# Patient Record
Sex: Male | Born: 1935 | Race: White | Hispanic: No | Marital: Married | Smoking: Never smoker
Health system: Southern US, Community
[De-identification: ages and names within clinical notes are randomized; demographics above are authoritative.]

## PROBLEM LIST (undated history)

## (undated) DIAGNOSIS — K219 Gastro-esophageal reflux disease without esophagitis: Secondary | ICD-10-CM

## (undated) DIAGNOSIS — G459 Transient cerebral ischemic attack, unspecified: Secondary | ICD-10-CM

## (undated) HISTORY — PX: CARDIAC SURGERY: SHX584

## (undated) HISTORY — PX: CORONARY ANGIOPLASTY WITH STENT PLACEMENT: SHX49

## (undated) HISTORY — PX: SKIN CANCER EXCISION: SHX779

## (undated) HISTORY — PX: PROSTATECTOMY: SHX69

---

## 2017-09-08 ENCOUNTER — Emergency Department (HOSPITAL_COMMUNITY): Payer: Medicare HMO

## 2017-09-08 ENCOUNTER — Other Ambulatory Visit: Payer: Self-pay

## 2017-09-08 ENCOUNTER — Encounter (HOSPITAL_COMMUNITY): Payer: Self-pay | Admitting: Emergency Medicine

## 2017-09-08 ENCOUNTER — Emergency Department (HOSPITAL_COMMUNITY)
Admission: EM | Admit: 2017-09-08 | Discharge: 2017-09-08 | Disposition: A | Discharge status: Home / Self Care | Payer: Medicare HMO | Attending: Emergency Medicine | Admitting: Emergency Medicine

## 2017-09-08 DIAGNOSIS — Z8673 Personal history of transient ischemic attack (TIA), and cerebral infarction without residual deficits: Secondary | ICD-10-CM | POA: Insufficient documentation

## 2017-09-08 DIAGNOSIS — K219 Gastro-esophageal reflux disease without esophagitis: Secondary | ICD-10-CM | POA: Diagnosis not present

## 2017-09-08 DIAGNOSIS — R112 Nausea with vomiting, unspecified: Secondary | ICD-10-CM | POA: Insufficient documentation

## 2017-09-08 DIAGNOSIS — R109 Unspecified abdominal pain: Secondary | ICD-10-CM | POA: Diagnosis not present

## 2017-09-08 DIAGNOSIS — Z7982 Long term (current) use of aspirin: Secondary | ICD-10-CM | POA: Diagnosis not present

## 2017-09-08 DIAGNOSIS — Z79899 Other long term (current) drug therapy: Secondary | ICD-10-CM | POA: Diagnosis not present

## 2017-09-08 DIAGNOSIS — R195 Other fecal abnormalities: Secondary | ICD-10-CM | POA: Insufficient documentation

## 2017-09-08 DIAGNOSIS — R197 Diarrhea, unspecified: Secondary | ICD-10-CM

## 2017-09-08 HISTORY — DX: Gastro-esophageal reflux disease without esophagitis: K21.9

## 2017-09-08 HISTORY — DX: Transient cerebral ischemic attack, unspecified: G45.9

## 2017-09-08 LAB — COMPREHENSIVE METABOLIC PANEL
ALBUMIN: 4.2 g/dL (ref 3.5–5.0)
ALT: 22 U/L (ref 17–63)
ANION GAP: 11 (ref 5–15)
AST: 35 U/L (ref 15–41)
Alkaline Phosphatase: 66 U/L (ref 38–126)
BUN: 22 mg/dL — AB (ref 6–20)
CHLORIDE: 103 mmol/L (ref 101–111)
CO2: 25 mmol/L (ref 22–32)
Calcium: 8.9 mg/dL (ref 8.9–10.3)
Creatinine, Ser: 1.12 mg/dL (ref 0.61–1.24)
GFR calc Af Amer: >60 mL/min (ref >60)
GFR, EST NON AFRICAN AMERICAN: 59 mL/min — AB (ref >60)
GLUCOSE: 143 mg/dL — AB (ref 65–99)
POTASSIUM: 3.6 mmol/L (ref 3.5–5.1)
Sodium: 139 mmol/L (ref 135–145)
Total Bilirubin: 0.8 mg/dL (ref 0.3–1.2)
Total Protein: 7.4 g/dL (ref 6.5–8.1)

## 2017-09-08 LAB — CBC
HEMATOCRIT: 42.6 % (ref 39.0–52.0)
HEMOGLOBIN: 13.9 g/dL (ref 13.0–17.0)
MCH: 30.2 pg (ref 26.0–34.0)
MCHC: 32.6 g/dL (ref 30.0–36.0)
MCV: 92.6 fL (ref 78.0–100.0)
Platelets: 189 10*3/uL (ref 150–400)
RBC: 4.6 MIL/uL (ref 4.22–5.81)
RDW: 13.2 % (ref 11.5–15.5)
WBC: 8.9 10*3/uL (ref 4.0–10.5)

## 2017-09-08 LAB — URINALYSIS, ROUTINE W REFLEX MICROSCOPIC
Bacteria, UA: NONE SEEN
Bilirubin Urine: NEGATIVE
GLUCOSE, UA: NEGATIVE mg/dL
Hgb urine dipstick: NEGATIVE
Ketones, ur: 5 mg/dL — AB
Leukocytes, UA: NEGATIVE
NITRITE: NEGATIVE
PH: 5 (ref 5.0–8.0)
Protein, ur: 30 mg/dL — AB
SPECIFIC GRAVITY, URINE: 1.027 (ref 1.005–1.030)
Squamous Epithelial / LPF: NONE SEEN

## 2017-09-08 LAB — CBC WITH DIFFERENTIAL/PLATELET
BASOS ABS: 0 10*3/uL (ref 0.0–0.1)
BASOS PCT: 0 %
EOS PCT: 0 %
Eosinophils Absolute: 0 10*3/uL (ref 0.0–0.7)
HCT: 48.8 % (ref 39.0–52.0)
Hemoglobin: 16.1 g/dL (ref 13.0–17.0)
LYMPHS PCT: 6 %
Lymphs Abs: 0.7 10*3/uL (ref 0.7–4.0)
MCH: 30.6 pg (ref 26.0–34.0)
MCHC: 33 g/dL (ref 30.0–36.0)
MCV: 92.6 fL (ref 78.0–100.0)
Monocytes Absolute: 0.6 10*3/uL (ref 0.1–1.0)
Monocytes Relative: 5 %
Neutro Abs: 10.9 10*3/uL — ABNORMAL HIGH (ref 1.7–7.7)
Neutrophils Relative %: 89 %
PLATELETS: 236 10*3/uL (ref 150–400)
RBC: 5.27 MIL/uL (ref 4.22–5.81)
RDW: 13.2 % (ref 11.5–15.5)
WBC: 12.2 10*3/uL — AB (ref 4.0–10.5)

## 2017-09-08 LAB — POC OCCULT BLOOD, ED: FECAL OCCULT BLD: NEGATIVE

## 2017-09-08 LAB — LACTIC ACID, PLASMA
LACTIC ACID, VENOUS: 1.9 mmol/L (ref 0.5–1.9)
LACTIC ACID, VENOUS: 1.6 mmol/L (ref 0.5–1.9)

## 2017-09-08 LAB — PROTIME-INR
INR: 0.99
PROTHROMBIN TIME: 13 s (ref 11.4–15.2)

## 2017-09-08 LAB — TROPONIN I: Troponin I: <0.03 ng/mL (ref <0.03)

## 2017-09-08 LAB — LIPASE, BLOOD: LIPASE: 20 U/L (ref 11–51)

## 2017-09-08 MED ORDER — ONDANSETRON HCL 4 MG/2ML IJ SOLN
4.0000 mg | INTRAMUSCULAR | Status: DC | PRN
Start: 2017-09-08 — End: 2017-09-08
  Administered 2017-09-08: 4 mg via INTRAVENOUS
  Filled 2017-09-08: qty 2

## 2017-09-08 MED ORDER — FAMOTIDINE IN NACL 20-0.9 MG/50ML-% IV SOLN
20.0000 mg | Freq: Once | INTRAVENOUS | Status: AC
  Administered 2017-09-08: 20 mg via INTRAVENOUS
  Filled 2017-09-08: qty 50

## 2017-09-08 MED ORDER — SODIUM CHLORIDE 0.9 % IV SOLN
INTRAVENOUS | Status: DC
  Administered 2017-09-08: 09:00:00 via INTRAVENOUS

## 2017-09-08 MED ORDER — IOPAMIDOL (ISOVUE-300) INJECTION 61%
100.0000 mL | Freq: Once | INTRAVENOUS | Status: AC | PRN
  Administered 2017-09-08: 100 mL via INTRAVENOUS

## 2017-09-08 MED ORDER — MORPHINE SULFATE (PF) 2 MG/ML IV SOLN
2.0000 mg | INTRAVENOUS | Status: DC | PRN
  Filled 2017-09-08: qty 1

## 2017-09-08 MED ORDER — ONDANSETRON 4 MG PO TBDP: 4.0000 mg | ORAL_TABLET | Freq: Three times a day (TID) | ORAL | 0 refills | Status: AC | PRN

## 2017-09-08 NOTE — ED Notes (Signed)
Patient transported to CT via Stretcher.

## 2017-09-08 NOTE — ED Notes (Signed)
PT tolerating some po fluids and denies any nausea at this time.

## 2017-09-08 NOTE — ED Provider Notes (Signed)
[facility] Provider Note   CSN: [id number] Arrival date & time: 09/08/17  [id number]     History   Chief Complaint Chief Complaint  Patient presents with  . Abdominal Pain    HPI Sanjuana KavaRonald Koy is a 83 y.o. male.  HPI  Pt was seen at 0905. Per pt, c/o gradual onset and persistence of multiple intermittent episodes of N/V/D that began yesterday.  Describes the emesis as "brown" yesterday, and "black gritty" today. Describes the stools as "brown." Has been associated with generalized abd "pain."  Denies CP/SOB, no back pain, no fevers, no black or blood in stools, no red blood in emesis.    Past Medical History:  Diagnosis Date  . GERD (gastroesophageal reflux disease)   . TIA (transient ischemic attack)     There are no active problems to display for this patient.     Home Medications    Prior to Admission medications   Medication Sig Start Date End Date Taking? Authorizing Provider  amLODipine (NORVASC) 2.5 MG tablet Take 1 tablet by mouth daily. 06/27/17  Yes [provider]  aspirin EC 81 MG tablet Take 81 mg by mouth daily.   Yes [provider]  clopidogrel (PLAVIX) 75 MG tablet Take 1 tablet by mouth daily. 06/27/17  Yes [provider]  Multiple Vitamin (MULTIVITAMIN WITH MINERALS) TABS tablet Take 1 tablet by mouth daily.   Yes [provider]  nitroGLYCERIN (NITROSTAT) 0.4 MG SL tablet Place 1 tablet under the tongue daily as needed for chest pain. 06/27/17  Yes [provider]  Omega-3 Fatty Acids (FISH OIL PO) Take 1 capsule by mouth daily.   Yes [provider]  RANEXA 500 MG 12 hr tablet Take 1 tablet by mouth 2 (two) times daily. 06/09/17  Yes [provider]  traZODone (DESYREL) 50 MG tablet Take 2 tablets by mouth at bedtime. 08/06/17  Yes [provider]    Family History No family history on file.  Social History Social History   Tobacco Use  . Smoking status:  Never Smoker  . Smokeless tobacco: Never Used  Substance Use Topics  . Alcohol use: No    Frequency: Never  . Drug use: No     Allergies   Atorvastatin   Review of Systems Review of Systems ROS: Statement: All systems negative except as marked or noted in the HPI; Constitutional: Negative for fever and chills. ; ; Eyes: Negative for eye pain, redness and discharge. ; ; ENMT: Negative for ear pain, hoarseness, nasal congestion, sinus pressure and sore throat. ; ; Cardiovascular: Negative for chest pain, palpitations, diaphoresis, dyspnea and peripheral edema. ; ; Respiratory: Negative for cough, wheezing and stridor. ; ; Gastrointestinal: +N/V/D, abd pain. Negative for blood in stool, hematemesis, jaundice and rectal bleeding. . ; ; Genitourinary: Negative for dysuria, flank pain and hematuria. ; ; Musculoskeletal: Negative for back pain and neck pain. Negative for swelling and trauma.; ; Skin: Negative for pruritus, rash, abrasions, blisters, bruising and skin lesion.; ; Neuro: Negative for headache, lightheadedness and neck stiffness. Negative for weakness, altered level of consciousness, altered mental status, extremity weakness, paresthesias, involuntary movement, seizure and syncope.       Physical Exam Updated Vital Signs BP (!) 115/101 (BP Location: Right Arm)   Pulse 84   Temp 98.4 F (36.9 C) (Oral)   Resp 18   Ht 5\' 6"  (1.676 m)   Wt 69.9 kg (154 lb)   SpO2 99%  BMI 24.86 kg/m   Patient Vitals for the past 24 hrs:  BP Temp Temp src Pulse Resp SpO2 Height Weight  09/08/17 1300 (!) 144/65 - - 67 20 100 % - -  09/08/17 1230 (!) 148/69 - - 83 (!) 22 100 % - -  09/08/17 1200 (!) 144/95 - - 70 20 100 % - -  09/08/17 1155 (!) 151/75 - - 78 13 100 % - -  09/08/17 1130 - - - 72 (!) 23 100 % - -  09/08/17 1100 - - - 79 17 100 % - -  09/08/17 1030 - - - 77 18 99 % - -  09/08/17 1000 - - - 84 18 94 % - -  09/08/17 0945 - - - 83 20 93 % - -  09/08/17 0903 (!) 115/101 98.4 F  (36.9 C) Oral 84 18 99 % 5\' 6"  (1.676 m) 69.9 kg (154 lb)     Physical Exam 0910: Physical examination:  Nursing notes reviewed; Vital signs and O2 SAT reviewed;  Constitutional: Well developed, Well nourished, In no acute distress; Head:  Normocephalic, atraumatic; Eyes: EOMI, PERRL, No scleral icterus; ENMT: Mouth and pharynx normal, Mucous membranes dry; Neck: Supple, Full range of motion, No lymphadenopathy; Cardiovascular: Regular rate and rhythm, No gallop; Respiratory: Breath sounds clear & equal bilaterally, No wheezes.  Speaking full sentences with ease, Normal respiratory effort/excursion; Chest: Nontender, Movement normal; Abdomen: Soft, +mild diffuse tenderness to palp. No rebound or guarding. Nondistended, Normal bowel sounds. Rectal exam performed w/permission of pt and ED RN chaperone present.  Anal tone normal.  Non-tender, soft brown stool in rectal vault, heme neg.  No fissures, no external hemorrhoids, no palp masses.;; Genitourinary: No CVA tenderness; Extremities: Pulses normal, No tenderness, No edema, No calf edema or asymmetry.; Neuro: AA&Ox3, Major CN grossly intact.  Speech clear. No gross focal motor or sensory deficits in extremities.; Skin: Color normal, Warm, Dry.   ED Treatments / Results  Labs (all labs ordered are listed, but only abnormal results are displayed)   EKG  EKG Interpretation  Date/Time:  Saturday September 08 2017 09:39:19 EST Ventricular Rate:  80 PR Interval:    QRS Duration: 94 QT Interval:  389 QTC Calculation: 449 R Axis:   -48 Text Interpretation:  Sinus rhythm Left anterior fascicular block Nonspecific T abnormalities, lateral leads No old tracing to compare Confirmed by Samuel JesterMcManus, Myeshia Fojtik [phone]([id number]) on 09/08/2017 11:50:17 AM       Radiology   Procedures Procedures (including critical care time)  Medications Ordered in ED Medications  ondansetron (ZOFRAN) injection 4 mg (4 mg Intravenous Given 09/08/17 0925)  0.9 %  sodium chloride  infusion ( Intravenous New Bag/Given 09/08/17 0925)  morphine 2 MG/ML injection 2 mg (2 mg Intravenous Not Given 09/08/17 0924)  famotidine (PEPCID) IVPB 20 mg premix (0 mg Intravenous Stopped 09/08/17 0955)  iopamidol (ISOVUE-300) 61 % injection 100 mL (100 mLs Intravenous Contrast Given 09/08/17 1037)     Initial Impression / Assessment and Plan / ED Course  I have reviewed the triage vital signs and the nursing notes.  Pertinent labs & imaging results that were available during my care of the patient were reviewed by me and considered in my medical decision making (see chart for details).  MDM Reviewed: nursing note and vitals Interpretation: labs, ECG, x-ray and CT scan    Results for orders placed or performed during the hospital encounter of 09/08/17  Comprehensive metabolic panel  Result Value Ref Range  Sodium 139 135 - 145 mmol/L   Potassium 3.6 3.5 - 5.1 mmol/L   Chloride 103 101 - 111 mmol/L   CO2 25 22 - 32 mmol/L   Glucose, Bld 143 (H) 65 - 99 mg/dL   BUN 22 (H) 6 - 20 mg/dL   Creatinine, Ser 4.09 0.61 - 1.24 mg/dL   Calcium 8.9 8.9 - 81.1 mg/dL   Total Protein 7.4 6.5 - 8.1 g/dL   Albumin 4.2 3.5 - 5.0 g/dL   AST 35 15 - 41 U/L   ALT 22 17 - 63 U/L   Alkaline Phosphatase 66 38 - 126 U/L   Total Bilirubin 0.8 0.3 - 1.2 mg/dL   GFR calc non Af Amer 59 (L) >60 mL/min   GFR calc Af Amer >60 >60 mL/min   Anion gap 11 5 - 15  Lipase, blood  Result Value Ref Range   Lipase 20 11 - 51 U/L  Troponin I  Result Value Ref Range   Troponin I <0.03 <0.03 ng/mL  Lactic acid, plasma  Result Value Ref Range   Lactic Acid, Venous 1.9 0.5 - 1.9 mmol/L  Lactic acid, plasma  Result Value Ref Range   Lactic Acid, Venous 1.6 0.5 - 1.9 mmol/L  CBC with Differential  Result Value Ref Range   WBC 12.2 (H) 4.0 - 10.5 K/uL   RBC 5.27 4.22 - 5.81 MIL/uL   Hemoglobin 16.1 13.0 - 17.0 g/dL   HCT 91.4 78.2 - 95.6 %   MCV 92.6 78.0 - 100.0 fL   MCH 30.6 26.0 - 34.0 pg   MCHC 33.0  30.0 - 36.0 g/dL   RDW 21.3 08.6 - 57.8 %   Platelets 236 150 - 400 K/uL   Neutrophils Relative % 89 %   Neutro Abs 10.9 (H) 1.7 - 7.7 K/uL   Lymphocytes Relative 6 %   Lymphs Abs 0.7 0.7 - 4.0 K/uL   Monocytes Relative 5 %   Monocytes Absolute 0.6 0.1 - 1.0 K/uL   Eosinophils Relative 0 %   Eosinophils Absolute 0.0 0.0 - 0.7 K/uL   Basophils Relative 0 %   Basophils Absolute 0.0 0.0 - 0.1 K/uL  Protime-INR  Result Value Ref Range   Prothrombin Time 13.0 11.4 - 15.2 seconds   INR 0.99   Urinalysis, Routine w reflex microscopic  Result Value Ref Range   Color, Urine AMBER (A) YELLOW   APPearance CLEAR CLEAR   Specific Gravity, Urine 1.027 1.005 - 1.030   pH 5.0 5.0 - 8.0   Glucose, UA NEGATIVE NEGATIVE mg/dL   Hgb urine dipstick NEGATIVE NEGATIVE   Bilirubin Urine NEGATIVE NEGATIVE   Ketones, ur 5 (A) NEGATIVE mg/dL   Protein, ur 30 (A) NEGATIVE mg/dL   Nitrite NEGATIVE NEGATIVE   Leukocytes, UA NEGATIVE NEGATIVE   RBC / HPF 6-30 0 - 5 RBC/hpf   WBC, UA 0-5 0 - 5 WBC/hpf   Bacteria, UA NONE SEEN NONE SEEN   Squamous Epithelial / LPF NONE SEEN NONE SEEN   Mucus PRESENT    Hyaline Casts, UA PRESENT   CBC  Result Value Ref Range   WBC 8.9 4.0 - 10.5 K/uL   RBC 4.60 4.22 - 5.81 MIL/uL   Hemoglobin 13.9 13.0 - 17.0 g/dL   HCT 46.9 62.9 - 52.8 %   MCV 92.6 78.0 - 100.0 fL   MCH 30.2 26.0 - 34.0 pg   MCHC 32.6 30.0 - 36.0 g/dL   RDW 41.3 24.4 - 01.0 %  Platelets 189 150 - 400 K/uL  POC occult blood, ED Provider will collect  Result Value Ref Range   Fecal Occult Bld NEGATIVE NEGATIVE   Dg Chest 2 View Result Date: 09/08/2017 CLINICAL DATA:  Abdominal pain, nausea, vomiting and diarrhea today EXAM: CHEST  2 VIEW COMPARISON:  None FINDINGS: Normal heart size, mediastinal contours, and pulmonary vascularity. Coronary artery stent noted. Atherosclerotic calcification aorta. Accentuation of upper lobe markings and biapical scarring. Lungs otherwise clear. No acute infiltrate,  pleural effusion or pneumothorax. Scattered mild degenerative changes of thoracic spine and AC joints without acute osseous findings. IMPRESSION: COPD changes without acute infiltrate. Electronically Signed   By: Ulyses Southward M.D.   On: 09/08/2017 11:14   Ct Abdomen Pelvis W Contrast Result Date: 09/08/2017 CLINICAL DATA:  Abdominal pain, "black,gritty emesis" since yesterday. Hx of prostate cancer treated with surgery,hernia repair,GERD, TIA. EXAM: CT ABDOMEN AND PELVIS WITH CONTRAST TECHNIQUE: Multidetector CT imaging of the abdomen and pelvis was performed using the standard protocol following bolus administration of intravenous contrast. CONTRAST:  ISOVUE-300 IOPAMIDOL (ISOVUE-300) INJECTION 61% COMPARISON:  None. FINDINGS: Lower chest: Clear lung bases.  Heart normal size. Hepatobiliary: Multiple low-density liver masses, largest in segment 2 measuring 2.3 cm, all consistent with cysts. No other liver abnormalities. Normal gallbladder. No bile duct dilation. Pancreas: Unremarkable. No pancreatic ductal dilatation or surrounding inflammatory changes. Spleen: Normal in size without focal abnormality. Adrenals/Urinary Tract: No adrenal masses. Small bilateral intrarenal stones, more numerous on the right. Subcentimeter low-density right renal masses consistent with cysts. No other renal abnormalities. No hydronephrosis. Normal ureters. Normal bladder. Stomach/Bowel: Small sliding hiatal hernia. No stomach mass or definite wall thickening. No adjacent inflammation. Small bowel is unremarkable. No colonic wall thickening, dilation or adjacent inflammation. Normal appendix. Vascular/Lymphatic: Aortic atherosclerosis. No enlarged abdominal or pelvic lymph nodes. Reproductive: Status post prostatectomy. Other: No abdominal wall hernia or abnormality. No abdominopelvic ascites. Musculoskeletal: No fracture or acute finding. No osteoblastic or osteolytic lesions. IMPRESSION: 1. No acute findings.  No evidence of  bowel inflammation. 2. Multiple low-density liver lesions consistent with cysts. Small low-density right renal lesions also consistent with cysts. 3. Small nonobstructing intrarenal stones. 4. Aortic atherosclerosis. Electronically Signed   By: Amie Portland M.D.   On: 09/08/2017 11:07    1330:  IVF NS given for clinical dehydration. Stool negative for blood and VS are stable. Pt have been given meds with improvement.  Pt has tol PO well while in the ED without N/V.  No stooling while in the ED.  Abd benign, VSS. Pt states he feels better and wants to go home now. 2nd H/H slightly lower than 1st, likely d/t dehydration and subsequent IVF bolus. No clear indication for admission at this time. T/C to GI Dr. Jena Gauss, case discussed, including:  HPI, pertinent PM/SHx, VS/PE, dx testing, ED course and treatment:  Agrees no clear indication for admission at this time, tx symptomatically, have pt continue his PPI, f/u outpt. Pt states he has "enough" Nexium and does not need another rx. Will rx zofran prn. Strict return precautions given.  Dx and testing d/w pt and family.  Questions answered.  Verb understanding, agreeable to d/c home with outpt f/u.    Final Clinical Impressions(s) / ED Diagnoses   Final diagnoses:  None    ED Discharge Orders    None       Samuel Jester, DO 09/12/17 1949

## 2017-09-08 NOTE — Discharge Instructions (Signed)
Take the prescription as directed. Continue to take your Nexium as previously directed.  Increase your fluid intake (ie:  Gatoraide) for the next few days, as discussed. Start with a liquid diet today and tomorrow, then slowly increase to a bland diet. Advance then to your regular diet slowly as you can tolerate it.   Avoid full strength juices, as well as milk and milk products until your diarrhea has resolved.   Call your regular medical doctor and your GI doctor Monday to schedule a follow up appointment this week.  Return to the Emergency Department immediately if not improving (or even worsening) despite taking the medicines as prescribed, any black or bloody stool, bloody vomit, if you develop a fever over "101," or for any other concerns.

## 2017-09-08 NOTE — ED Triage Notes (Signed)
Pt c/o of v/d starting yesterday. This am, pt states vomiting up "black gritty" emesis. No fever.

## 2019-04-23 IMAGING — CT CT ABD-PELV W/ CM
2 of 5 series · 16 of 46 positions shown, 18 images · IV contrast (Isovue)
Comparison: None.

CLINICAL DATA: Abdominal pain, "black,gritty emesis" since
yesterday. Hx of prostate cancer treated with surgery,hernia
repair,GERD, TIA.

EXAM:
CT ABDOMEN AND PELVIS WITH CONTRAST
TECHNIQUE: Multidetector CT imaging of the abdomen and pelvis was performed
using the standard protocol following bolus administration of
intravenous contrast.
CONTRAST:  100mL CYT27T-QQQ IOPAMIDOL (CYT27T-QQQ) INJECTION 61%

[Series 2: axial st · axial · 0.69mm/px · z∈[-314,+61]mm · 13 of 87 slices shown, 15 images]
[im 6/87  soft-tissue]
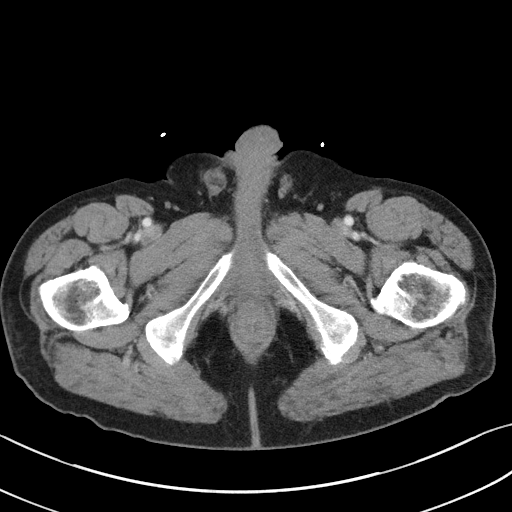
[im 6/87  bone]
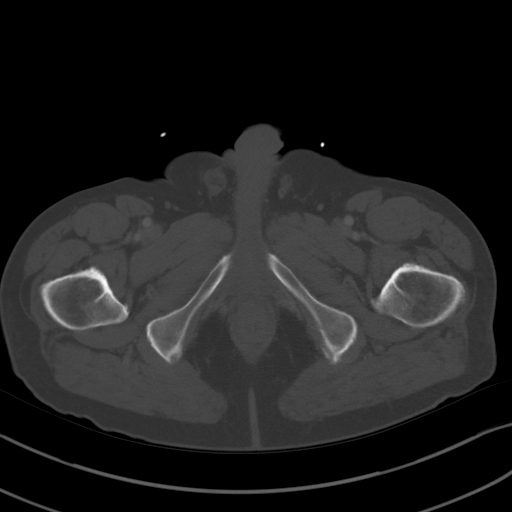
[im 11/87  soft-tissue]
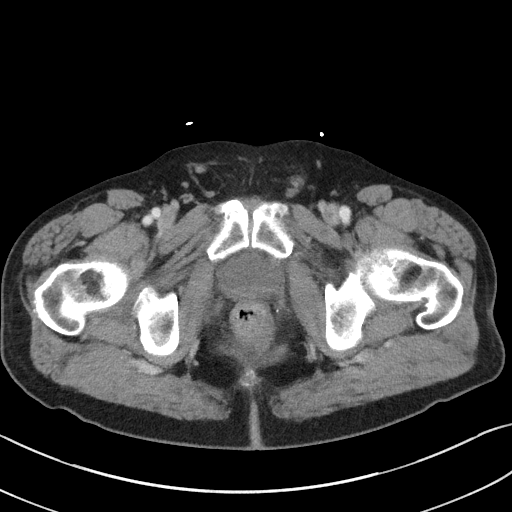
[im 21/87  soft-tissue]
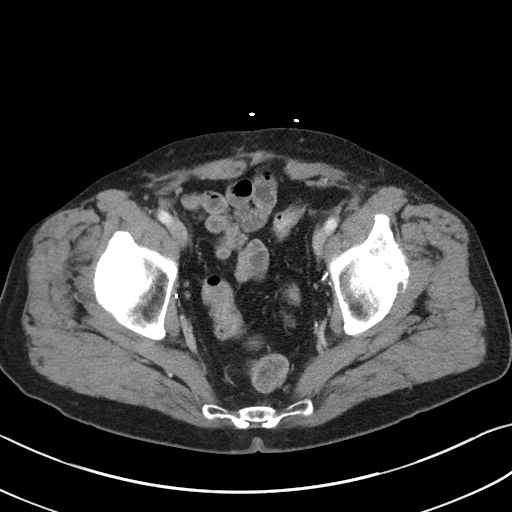
[im 26/87  soft-tissue]
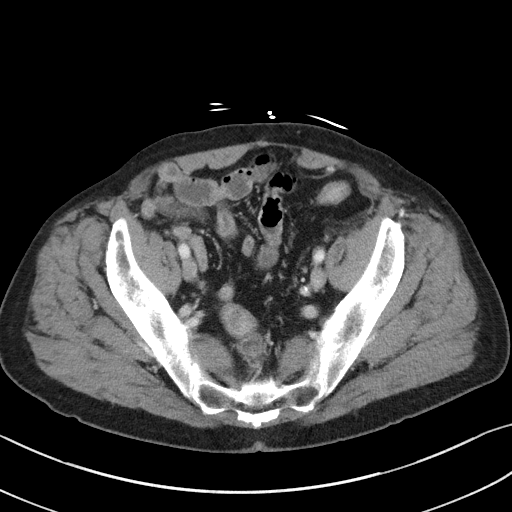
[im 31/87  soft-tissue]
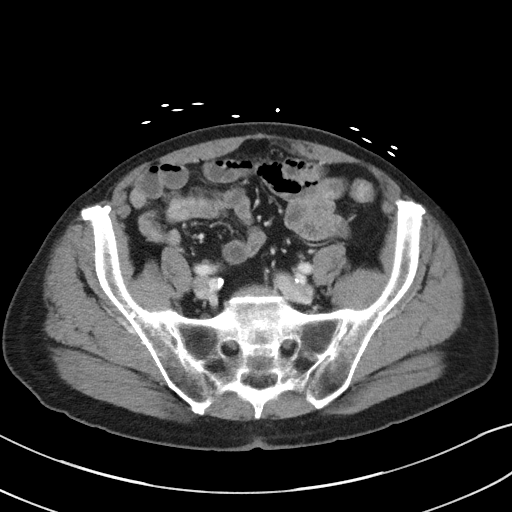
[im 36/87  soft-tissue]
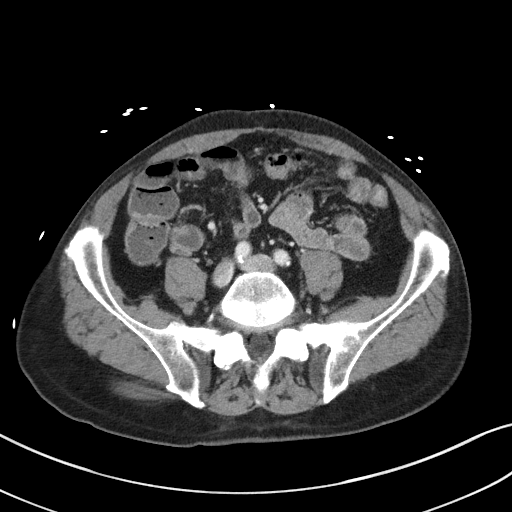
[im 46/87  soft-tissue]
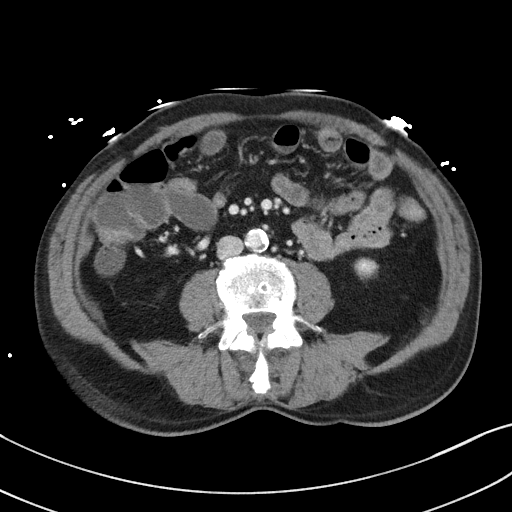
[im 51/87  soft-tissue]
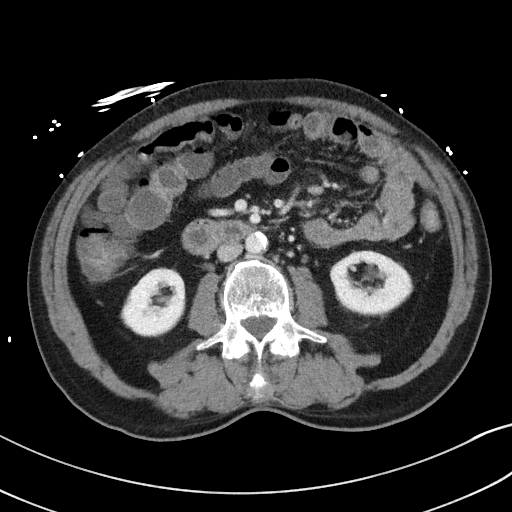
[im 56/87  soft-tissue]
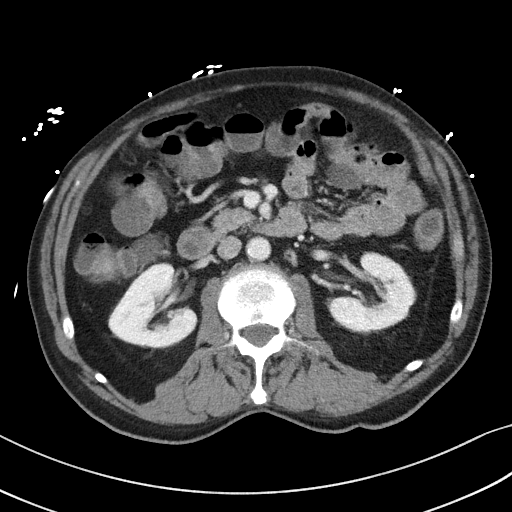
[im 56/87  bone]
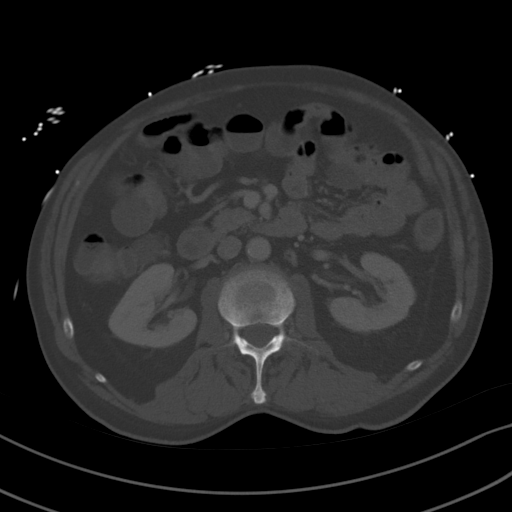
[im 61/87  soft-tissue]
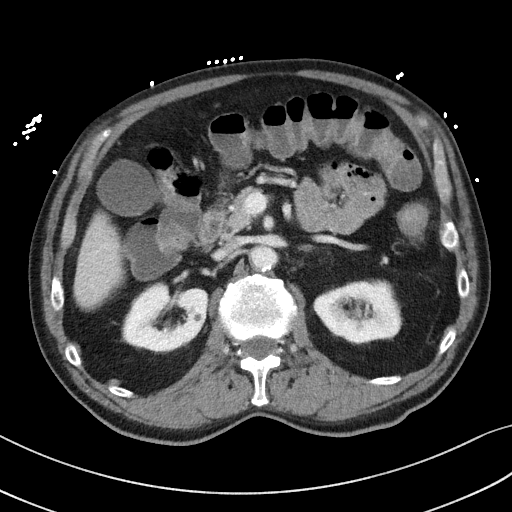
[im 66/87  soft-tissue]
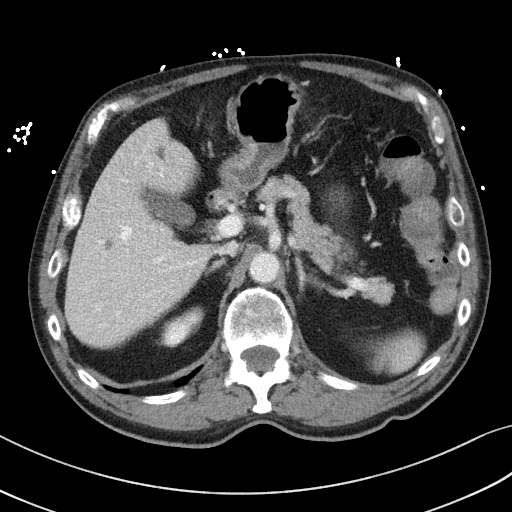
[im 76/87  soft-tissue]
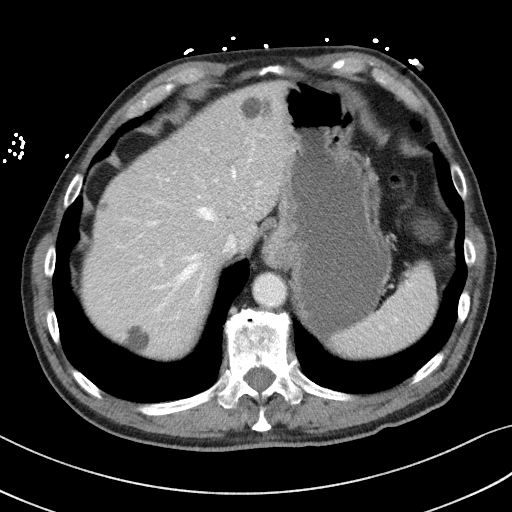
[im 81/87  soft-tissue]
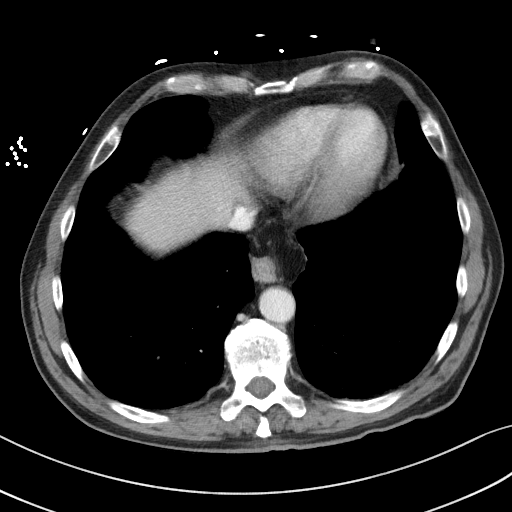

[Series 6: coronal st · coronal · 0.75mm/px · 3 of 107 slices shown]
[im 36/107  soft-tissue]
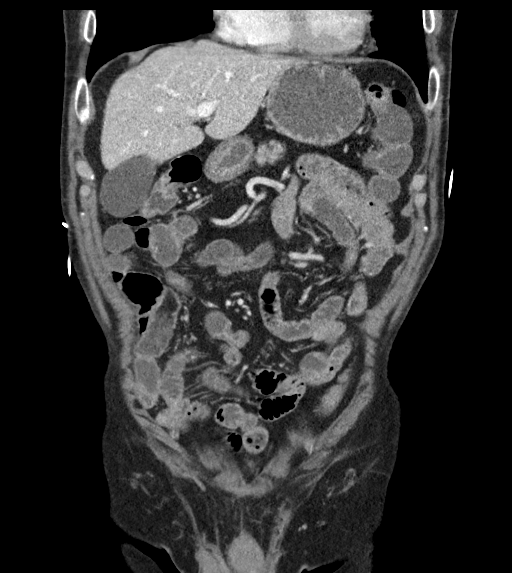
[im 48/107  soft-tissue]
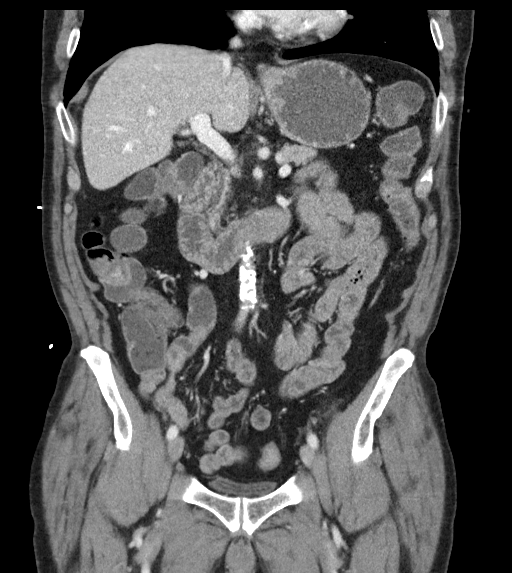
[im 59/107  soft-tissue]
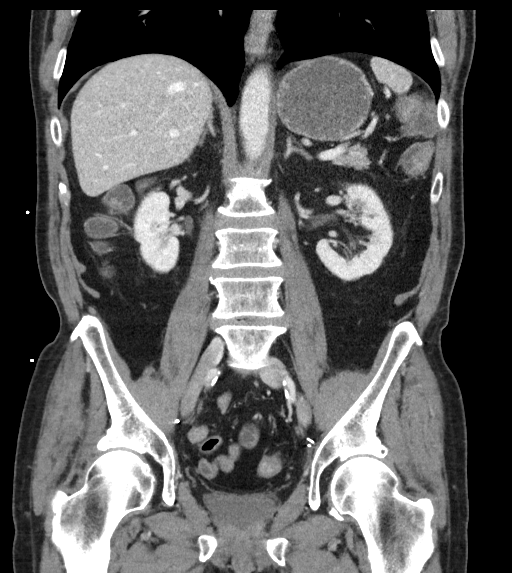

[16 of 46 positions shown; findings below may reference images not displayed]

FINDINGS: Lower chest: Clear lung bases.  Heart normal size.

Hepatobiliary: Multiple low-density liver masses, largest in segment
2 measuring 2.3 cm, all consistent with cysts. No other liver
abnormalities. Normal gallbladder. No bile duct dilation.

Pancreas: Unremarkable. No pancreatic ductal dilatation or
surrounding inflammatory changes.

Spleen: Normal in size without focal abnormality.

Adrenals/Urinary Tract: No adrenal masses.

Small bilateral intrarenal stones, more numerous on the right.
Subcentimeter low-density right renal masses consistent with cysts.
No other renal abnormalities. No hydronephrosis. Normal ureters.
Normal bladder.

Stomach/Bowel: Small sliding hiatal hernia. No stomach mass or
definite wall thickening. No adjacent inflammation.

Small bowel is unremarkable. No colonic wall thickening, dilation or
adjacent inflammation. Normal appendix.

Vascular/Lymphatic: Aortic atherosclerosis. No enlarged abdominal or
pelvic lymph nodes.

Reproductive: Status post prostatectomy.

Other: No abdominal wall hernia or abnormality. No abdominopelvic
ascites.

Musculoskeletal: No fracture or acute finding. No osteoblastic or
osteolytic lesions.
IMPRESSION: 1. No acute findings.  No evidence of bowel inflammation.
2. Multiple low-density liver lesions consistent with cysts. Small
low-density right renal lesions also consistent with cysts.
3. Small nonobstructing intrarenal stones.
4. Aortic atherosclerosis.
